# Patient Record
Sex: Female | Born: 1935 | Race: White | Hispanic: No
Health system: Southern US, Community
[De-identification: ages and names within clinical notes are randomized; demographics above are authoritative.]

---

## 2005-05-02 ENCOUNTER — Ambulatory Visit: Payer: Self-pay | Admitting: Internal Medicine

## 2006-06-14 ENCOUNTER — Ambulatory Visit: Payer: Self-pay | Admitting: Internal Medicine

## 2007-05-05 ENCOUNTER — Ambulatory Visit: Payer: Self-pay | Admitting: Internal Medicine

## 2007-06-18 ENCOUNTER — Ambulatory Visit: Payer: Self-pay | Admitting: Internal Medicine

## 2007-12-15 ENCOUNTER — Other Ambulatory Visit: Payer: Self-pay

## 2007-12-15 ENCOUNTER — Emergency Department: Payer: Self-pay | Admitting: Internal Medicine

## 2007-12-21 ENCOUNTER — Other Ambulatory Visit: Payer: Self-pay

## 2007-12-21 ENCOUNTER — Inpatient Hospital Stay: Payer: Self-pay | Admitting: Specialist

## 2009-01-15 ENCOUNTER — Encounter: Payer: Self-pay | Admitting: Internal Medicine

## 2009-01-25 ENCOUNTER — Encounter: Payer: Self-pay | Admitting: Internal Medicine

## 2009-02-25 ENCOUNTER — Encounter: Payer: Self-pay | Admitting: Internal Medicine

## 2009-06-10 ENCOUNTER — Ambulatory Visit: Payer: Self-pay | Admitting: Internal Medicine

## 2013-11-22 ENCOUNTER — Observation Stay: Payer: Self-pay | Admitting: Family Medicine

## 2013-11-22 LAB — BASIC METABOLIC PANEL
Anion Gap: 10 (ref 7–16)
BUN: 12 mg/dL (ref 7–18)
CALCIUM: 8.7 mg/dL (ref 8.5–10.1)
CREATININE: 0.53 mg/dL — AB (ref 0.60–1.30)
Chloride: 102 mmol/L (ref 98–107)
Co2: 26 mmol/L (ref 21–32)
EGFR (African American): >60
EGFR (Non-African Amer.): >60
GLUCOSE: 67 mg/dL (ref 65–99)
OSMOLALITY: 274 (ref 275–301)
Potassium: 3.4 mmol/L — ABNORMAL LOW (ref 3.5–5.1)
SODIUM: 138 mmol/L (ref 136–145)

## 2013-11-22 LAB — URINALYSIS, COMPLETE
Bacteria: NONE SEEN
Bilirubin,UR: NEGATIVE
Blood: NEGATIVE
Glucose,UR: NEGATIVE mg/dL (ref 0–75)
Leukocyte Esterase: NEGATIVE
NITRITE: NEGATIVE
PH: 5 (ref 4.5–8.0)
Protein: NEGATIVE
RBC,UR: <1 /HPF (ref 0–5)
SPECIFIC GRAVITY: 1.021 (ref 1.003–1.030)
Squamous Epithelial: 3
WBC UR: 1 /HPF (ref 0–5)

## 2013-11-22 LAB — CBC WITH DIFFERENTIAL/PLATELET
BASOS ABS: 0.1 10*3/uL (ref 0.0–0.1)
BASOS PCT: 0.5 %
EOS ABS: 0.1 10*3/uL (ref 0.0–0.7)
Eosinophil %: 1 %
HCT: 36.1 % (ref 35.0–47.0)
HGB: 11.5 g/dL — ABNORMAL LOW (ref 12.0–16.0)
LYMPHS ABS: 1.2 10*3/uL (ref 1.0–3.6)
Lymphocyte %: 10.1 %
MCH: 27.9 pg (ref 26.0–34.0)
MCHC: 31.7 g/dL — ABNORMAL LOW (ref 32.0–36.0)
MCV: 88 fL (ref 80–100)
MONO ABS: 0.6 x10 3/mm (ref 0.2–0.9)
MONOS PCT: 4.9 %
NEUTROS PCT: 83.5 %
Neutrophil #: 9.9 10*3/uL — ABNORMAL HIGH (ref 1.4–6.5)
PLATELETS: 457 10*3/uL — AB (ref 150–440)
RBC: 4.1 10*6/uL (ref 3.80–5.20)
RDW: 14.4 % (ref 11.5–14.5)
WBC: 11.8 10*3/uL — ABNORMAL HIGH (ref 3.6–11.0)

## 2013-11-23 LAB — CBC WITH DIFFERENTIAL/PLATELET
BASOS PCT: 0 %
Basophil #: 0 10*3/uL (ref 0.0–0.1)
Eosinophil #: 0 10*3/uL (ref 0.0–0.7)
Eosinophil %: 0 %
HCT: 30.7 % — ABNORMAL LOW (ref 35.0–47.0)
HGB: 9.9 g/dL — ABNORMAL LOW (ref 12.0–16.0)
Lymphocyte #: 0.6 10*3/uL — ABNORMAL LOW (ref 1.0–3.6)
Lymphocyte %: 5.9 %
MCH: 28 pg (ref 26.0–34.0)
MCHC: 32.3 g/dL (ref 32.0–36.0)
MCV: 87 fL (ref 80–100)
Monocyte #: 0.3 x10 3/mm (ref 0.2–0.9)
Monocyte %: 2.6 %
NEUTROS ABS: 9.3 10*3/uL — AB (ref 1.4–6.5)
Neutrophil %: 91.5 %
PLATELETS: 424 10*3/uL (ref 150–440)
RBC: 3.53 10*6/uL — AB (ref 3.80–5.20)
RDW: 14.3 % (ref 11.5–14.5)
WBC: 10.2 10*3/uL (ref 3.6–11.0)

## 2013-11-23 LAB — BASIC METABOLIC PANEL
ANION GAP: 6 — AB (ref 7–16)
BUN: 13 mg/dL (ref 7–18)
Calcium, Total: 7.9 mg/dL — ABNORMAL LOW (ref 8.5–10.1)
Chloride: 104 mmol/L (ref 98–107)
Co2: 27 mmol/L (ref 21–32)
Creatinine: 0.57 mg/dL — ABNORMAL LOW (ref 0.60–1.30)
EGFR (African American): >60
Glucose: 132 mg/dL — ABNORMAL HIGH (ref 65–99)
Osmolality: 276 (ref 275–301)
POTASSIUM: 3.6 mmol/L (ref 3.5–5.1)
Sodium: 137 mmol/L (ref 136–145)

## 2013-11-25 ENCOUNTER — Ambulatory Visit
Admit: 2013-11-25 | Disposition: A | Discharge status: Home / Self Care | Payer: Self-pay | Attending: Nurse Practitioner | Admitting: Nurse Practitioner

## 2013-11-25 ENCOUNTER — Encounter: Payer: Self-pay | Admitting: Internal Medicine

## 2013-12-03 LAB — CBC WITH DIFFERENTIAL/PLATELET
BASOS ABS: 0.1 10*3/uL (ref 0.0–0.1)
Basophil %: 0.3 %
Eosinophil #: 0.1 10*3/uL (ref 0.0–0.7)
Eosinophil %: 0.5 %
HCT: 30.9 % — ABNORMAL LOW (ref 35.0–47.0)
HGB: 9.8 g/dL — ABNORMAL LOW (ref 12.0–16.0)
Lymphocyte #: 0.9 10*3/uL — ABNORMAL LOW (ref 1.0–3.6)
Lymphocyte %: 4.9 %
MCH: 27.4 pg (ref 26.0–34.0)
MCHC: 31.8 g/dL — AB (ref 32.0–36.0)
MCV: 86 fL (ref 80–100)
Monocyte #: 0.7 x10 3/mm (ref 0.2–0.9)
Monocyte %: 3.9 %
Neutrophil #: 17.2 10*3/uL — ABNORMAL HIGH (ref 1.4–6.5)
Neutrophil %: 90.4 %
PLATELETS: 519 10*3/uL — AB (ref 150–440)
RBC: 3.58 10*6/uL — ABNORMAL LOW (ref 3.80–5.20)
RDW: 14.5 % (ref 11.5–14.5)
WBC: 19.1 10*3/uL — AB (ref 3.6–11.0)

## 2013-12-03 LAB — BASIC METABOLIC PANEL
ANION GAP: 9 (ref 7–16)
BUN: 17 mg/dL (ref 7–18)
CHLORIDE: 101 mmol/L (ref 98–107)
CO2: 25 mmol/L (ref 21–32)
Calcium, Total: 8.2 mg/dL — ABNORMAL LOW (ref 8.5–10.1)
Creatinine: 0.77 mg/dL (ref 0.60–1.30)
EGFR (African American): >60
EGFR (Non-African Amer.): >60
GLUCOSE: 70 mg/dL (ref 65–99)
Osmolality: 270 (ref 275–301)
Potassium: 4.6 mmol/L (ref 3.5–5.1)
Sodium: 135 mmol/L — ABNORMAL LOW (ref 136–145)

## 2013-12-03 LAB — HEPATIC FUNCTION PANEL A (ARMC)
ALT: 16 U/L (ref 12–78)
Albumin: 1.7 g/dL — ABNORMAL LOW (ref 3.4–5.0)
Alkaline Phosphatase: 241 U/L — ABNORMAL HIGH
Bilirubin, Direct: <0.1 mg/dL (ref 0.00–0.20)
Bilirubin,Total: 0.3 mg/dL (ref 0.2–1.0)
SGOT(AST): 64 U/L — ABNORMAL HIGH (ref 15–37)
Total Protein: 6 g/dL — ABNORMAL LOW (ref 6.4–8.2)

## 2013-12-03 LAB — TSH: Thyroid Stimulating Horm: 4.88 u[IU]/mL — ABNORMAL HIGH

## 2013-12-09 LAB — CBC WITH DIFFERENTIAL/PLATELET
BASOS ABS: 0 10*3/uL (ref 0.0–0.1)
BASOS PCT: 0 %
EOS ABS: 0 10*3/uL (ref 0.0–0.7)
EOS PCT: 0.2 %
HCT: 28.9 % — AB (ref 35.0–47.0)
HGB: 9.2 g/dL — ABNORMAL LOW (ref 12.0–16.0)
LYMPHS ABS: 1.9 10*3/uL (ref 1.0–3.6)
LYMPHS PCT: 11.1 %
MCH: 27.8 pg (ref 26.0–34.0)
MCHC: 31.7 g/dL — ABNORMAL LOW (ref 32.0–36.0)
MCV: 88 fL (ref 80–100)
MONO ABS: 0.9 x10 3/mm (ref 0.2–0.9)
Monocyte %: 5.3 %
Neutrophil #: 14.6 10*3/uL — ABNORMAL HIGH (ref 1.4–6.5)
Neutrophil %: 83.4 %
Platelet: 522 10*3/uL — ABNORMAL HIGH (ref 150–440)
RBC: 3.31 10*6/uL — ABNORMAL LOW (ref 3.80–5.20)
RDW: 14.8 % — AB (ref 11.5–14.5)
WBC: 17.5 10*3/uL — AB (ref 3.6–11.0)

## 2013-12-25 DEATH — deceased

## 2014-10-18 NOTE — H&P (Signed)
PATIENT NAME:  Joyce Vega, Joyce Vega MR#:  [id number] DATE OF BIRTH:  11-25-1921  DATE OF ADMISSION:  11/22/2013  ADMITTING PHYSICIAN: Joyce Baas, MD.   PRIMARY CARE PHYSICIAN: Dr. Burnadette Vega  CHIEF COMPLAINT: Fall and back pain.   HISTORY OF PRESENT ILLNESS: Ms. Joyce Vega is an 81 year old Caucasian female with past medical history significant for severe interstitial pulmonary fibrosis, hypertension, hyperlipidemia, gastroesophageal reflux disease who lives at home with her daughter, was brought in secondary to 2  falls this week and worsening low back. According to daughter, who gives most of the history, the patient has been losing weight for the last 6 months and her appetite has become poor.  She was just started on Remeron as an outpatient last week and that did improve her appetite a little bit.  However, she has become more weak and had 2 falls last week while climbing into her antique bed. For the last 3 days since the second fall, her ambulation and has been decreased. She has been pretty much bed bound and hurts a lot in the mid back whenever she tries to move. Daughter was not able to take care of her. Her urine was getting dark. She was having worsening breathing and cough while lying in bed.  She is not eating or drinking anything in the last 3 days, so she brought her to the hospital. The patient appears dehydrated and has an age-indeterminate compression fracture, so she is being admitted under observation.   PAST MEDICAL HISTORY: 1.  Hyperlipidemia.  2.  Gastroesophageal reflux disease. 3.  Pulmonary fibrosis.  4.  Glaucoma.  5.  Severe osteoporosis.   PAST SURGICAL HISTORY: Right carotid endarterectomy, cataract surgery bilaterally.   ALLERGIES TO MEDICATIONS: No known drug allergies.  CURRENT HOME MEDICATIONS:  1.  Remeron 15 mg p.o. daily at bedtime.  2.  Aspirin 81 mg p.o. daily.  3.  Lovastatin 40 mg p.o. daily.  4.  Calcium vitamin D 1 tablet p.o. b.i.d.   SOCIAL  HISTORY: Lives at home. Daughter lives with her. Usually ambulatory with cane and assistance, but over the last 4 days not able to walk at all. No alcohol abuse. Used to smoke but quit more than 20 years ago.   FAMILY HISTORY: Noncontributory at this time.  REVIEW OF SYSTEMS:  CONSTITUTIONAL: No fever, fatigue or weakness.  EYES: Positive for glaucoma. No blurred vision, double vision or cataracts. Uses reading glasses.  ENT: No tinnitus, ear pain, hearing loss, epistaxis or discharge.  RESPIRATORY: Positive for coughing, minimal wheeze, positive dyspnea on exertion, COPD.  CARDIOVASCULAR: No chest pain, orthopnea, edema, arrhythmia, palpitations or syncope.  GASTROINTESTINAL: No nausea, vomiting, diarrhea, abdominal pain, hematemesis or melena.  GENITOURINARY: Positive for decreased frequency of urination and concentrated dark urine but no dysuria.  ENDOCRINE: No polyuria, nocturia, thyroid problems, heat or cold intolerance.  HEMATOLOGY: No anemia, easy bruising or bleeding.  SKIN: No acne, rash or lesions.  MUSCULOSKELETAL: Positive for low back and mid back pain. No arthritis or gout.  NEUROLOGIC: No numbness, weakness, CVA, TIA or seizures.  PSYCHOLOGICAL: No anxiety, insomnia or depression.   PHYSICAL EXAMINATION: VITAL SIGNS: Temperature 97.6 degrees Fahrenheit, pulse 84, respirations 20, blood pressure 114/56, pulse ox 94% on room air.  GENERAL: Very well-built, ill-nourished female lying in bed, not in any acute distress.  HEENT: Normocephalic, atraumatic. Pupils equal, round, reacting to light. Anicteric sclerae. Extraocular movements intact.  Oropharynx is clear without erythema, mass or exudates. NECK: Supple. No thyromegaly, JVD or carotid  bruits.  No lymphadenopathy.  LUNGS: She has diffuse coarse crackles bilaterally. No use of accessory muscles for breathing. No wheezing.  CARDIOVASCULAR: S1, S2, regular rate and rhythm. A 3/6 systolic murmur heard. No rubs or gallops.   ABDOMEN: Soft, nontender, nondistended. No hepatosplenomegaly. Normal bowel sounds.  EXTREMITIES: No pedal edema. No clubbing or cyanosis, 2+ dorsalis pedis pulses palpable bilaterally.  SKIN: No acne, rash or lesions.  LYMPHATICS: no cervical or inguinal lymphadenopathy NEUROLOGIC: Cranial nerves intact. No focal motor or sensory deficits. PSYCHOLOGIC:  The patient is awake, alert, oriented x 3.   LABORATORY DATA: WBC 11.8, hemoglobin 9.5, hematocrit 36.1, platelet 457.   Sodium 138, potassium 3.4, chloride 102, bicarbonate 26, BUN 12, creatinine 0.53, glucose 67 and calcium of 8.7. Urinalysis negative for any infection.  Chest x-ray showing diffuse fibrotic changes which are chronic, increased from prior exam. No acute infiltrates seen.   ASSESSMENT AND PLAN: This is an 81 year old female with chronic interstitial pulmonary fibrosis, hypertension, hyperlipidemia, who has been having weight loss for 6 months now and frequent falls, comes with severe low back pain and inability to move. 1.  Acute mid back pain with recent fall. CT at this time showing age indeterminate compression fractures and severe osteopenia. Will admit under observation for pain management and physical therapy. 2.  Pulmonary fibrosis with possible acute bronchitis with worsening dyspnea and also cough. Started on oral prednisone taper and Z-Pak and continue to monitor.  3.  Hyperlipemia on statin.  4.  Anorexia. Continue the Remeron, which was just started as an outpatient.  CODE STATUS: DO NOT RESUSCITATE, as the patient has out of facility DO NOT RESUSCITATE form signed by her PCP.  TOTAL TIME SPENT ON ADMISSION:  50 minutes cc    ____________________________ Joyce Joyce Essica Kiker, MD rk:[id number] D: 11/22/2013 16:19:18 ET T: 11/22/2013 17:07:26 ET JOB#: [id number]  cc: Joyce Joyce Joyce Tout, MD, <Dictator> Joyce Vega Linthavong, MD Joyce Joyce Kinberly Perris MD ELECTRONICALLY SIGNED 12/07/2013 14:06

## 2014-10-18 NOTE — Discharge Summary (Signed)
PATIENT NAME:  Joyce Vega, Joyce Vega MR#:  [id number] DATE OF BIRTH:  08-14-1921  DATE OF ADMISSION:  11/22/2013 DATE OF DISCHARGE:  11/25/2013  DISCHARGE DIAGNOSES:  1.  Back pain secondary to thoracic compression fracture, age undetermined.  2.  Acute bronchitis.  3.  Generalized weakness/falls risk.   DISCHARGE MEDICATIONS:  1.  Lovastatin 20 mg 2 tabs p.o. at bedtime.  2.  Mirtazapine 15 mg p.o. at bedtime.  3.  Acetaminophen 1000 mg p.o. every 8 hours scheduled.  4.  Prednisone  20 mg x1 dose tomorrow and then prednisone 10 mg one dose the following day and stop.  5.  Tramadol 50 mg p.o. q.6 to 8 hours as needed for breakthrough pain.  6.  Azithromycin 250 mg p.o. daily x 1 more dose, stop date is 11/27/2013.  7.  Calcium/vitamin D 500/200, 1 tab p.o. b.i.d. with meals.   PERTINENT LABORATORIES AND STUDIES: CT of the thoracic spine showed mild mid thoracic vertebral body compression fracture, age undetermined. Prior to discharge sodium 137, potassium 3.6, creatinine 0.57, glucose 132. White blood cell count 10.2, hemoglobin 9.9, platelets of 424.   BRIEF HOSPITAL COURSE:  1.  Acute back pain. The patient came in with back pain consistent with compression fracture thoracic spine seen on CT, age undetermined. Her pain was controlled without surgical intervention with Ultram for breakthrough pain and Tylenol as scheduled. The patient is [facility]ant to [facility] and also intolerance to MIACALCIN. We will continue with vitamin D and calcium supplementation at this time.  2.  Acute bronchitis. The patient came in with cough and shortness of breath consistent with acute bronchitis with underlying fibrosis. She was started on steroid therapy and Z-Pak and symptoms have started to improve. She will complete course of the prednisone taper and 1 more day of the Z-Pak. 3.  Generalized weakness. The patient has significant falls risk evaluated by PT meets criteria for SNF and further therapy. She is being  transferred to a skilled nursing facility for further rehab.   DISPOSITION: She is in stable condition to discharge out of the hospital for further rehab at a skilled nursing facility. [facility] after she is discharged from the facility.   TOTAL TIME OF DISCHARGE PLANNING: Greater than 30 minutes throughout the day.  ____________________________ Marisue IvanKanhka Aldea Avis, MD kl:aw D: 11/25/2013 14:00:32 ET T: 11/25/2013 14:08:23 ET JOB#: [id number]  cc: Marisue IvanKanhka Jaysin Gayler, MD, <Dictator> Marisue IvanKANHKA Sharmaine Bain MD ELECTRONICALLY SIGNED 12/24/2013 11:32

## 2016-01-07 IMAGING — CT CT THORACIC SPINE WITHOUT CONTRAST
4 of 11 series · 11 of 33 positions shown, 12 images · non-contrast
Comparison: Chest x-ray 11/22/2013 and 12/21/2007.

CLINICAL DATA: Fall.

EXAM:
CT THORACIC SPINE WITHOUT CONTRAST
TECHNIQUE: Multidetector CT imaging of the thoracic spine was performed without
intravenous contrast administration. Multiplanar CT image
reconstructions were also generated.

[Series 4: soft tissue · axial · 0.34mm/px · z∈[-408,-256]mm · 3 of 154 slices shown, 4 images]
[im 39/154  soft-tissue]
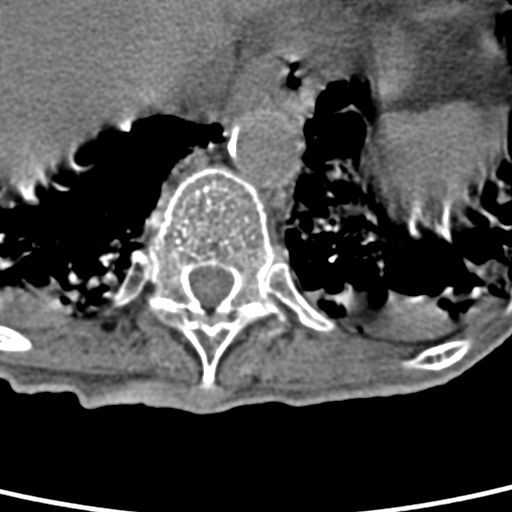
[im 39/154  bone]
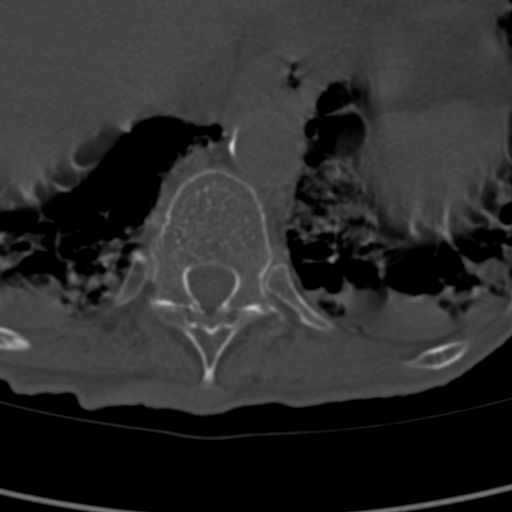
[im 77/154  bone]
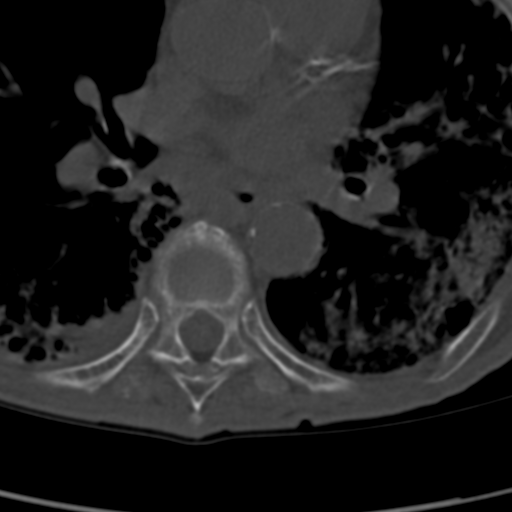
[im 115/154  bone]
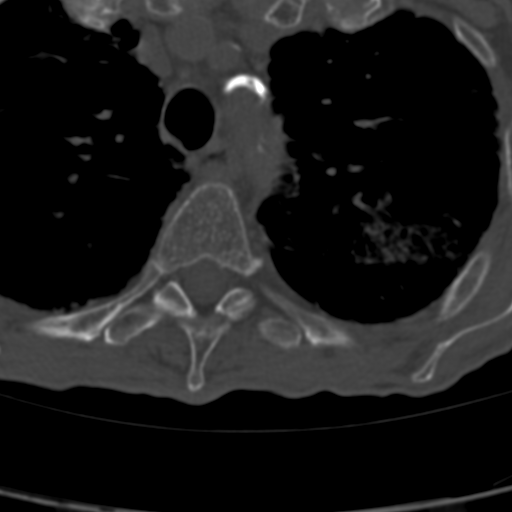

[Series 10: coronal bone · coronal · 0.34mm/px · 1 of 78 slices shown]
[im 39/78  bone]
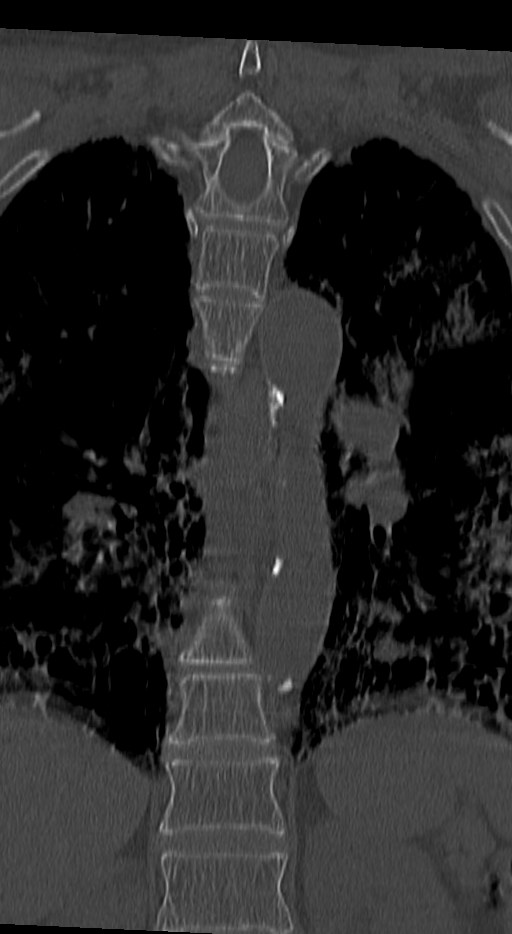

[Series 11: sagittal bone-- · sagittal · 0.36mm/px · 5 of 84 slices shown]
[im 14/84  bone]
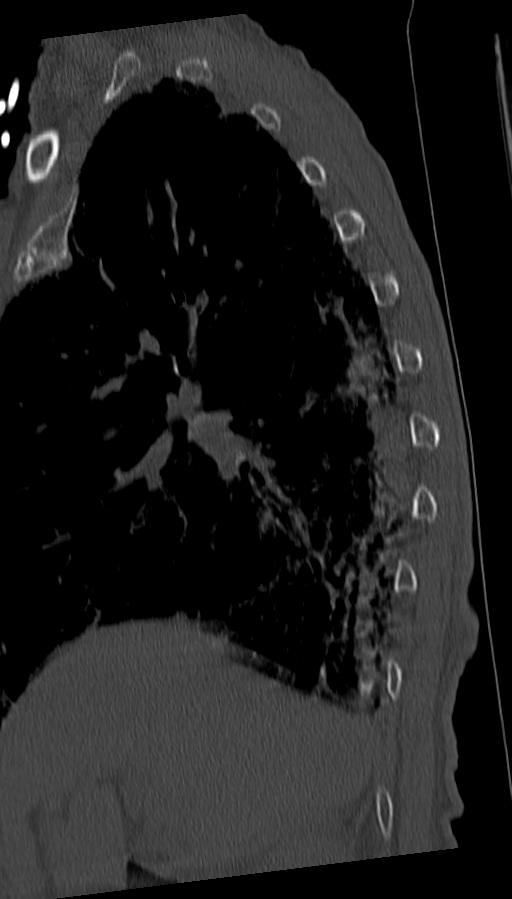
[im 28/84  bone]
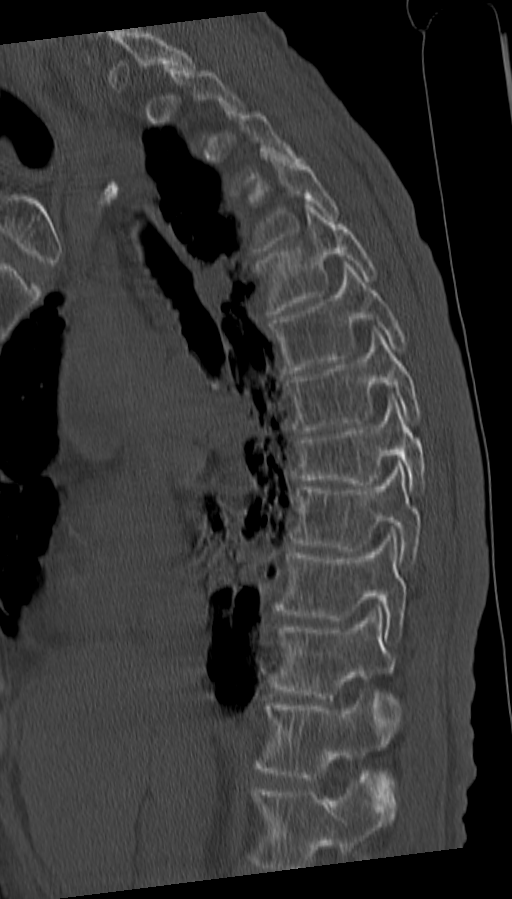
[im 42/84  bone]
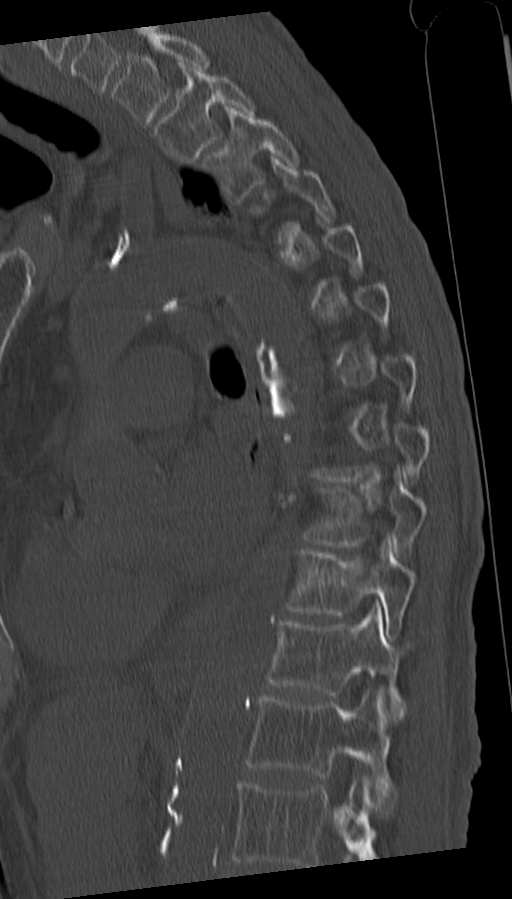
[im 56/84  bone]
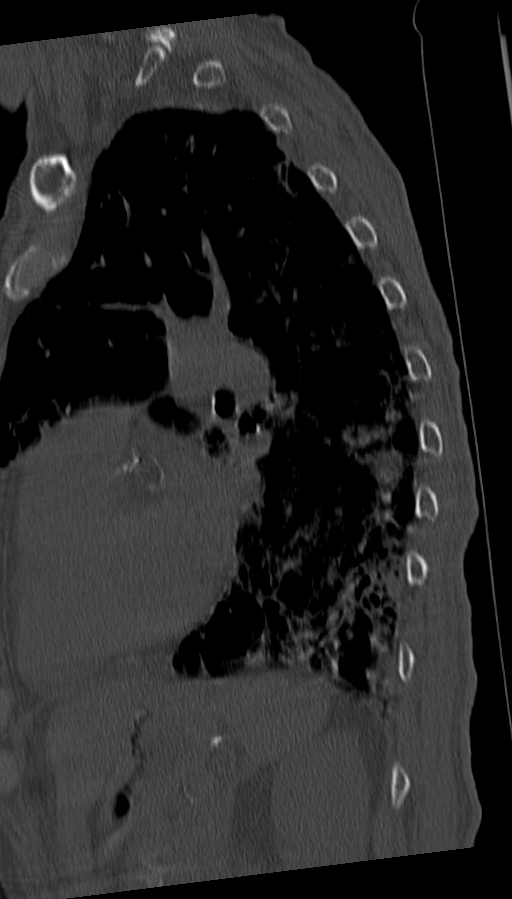
[im 70/84  bone]
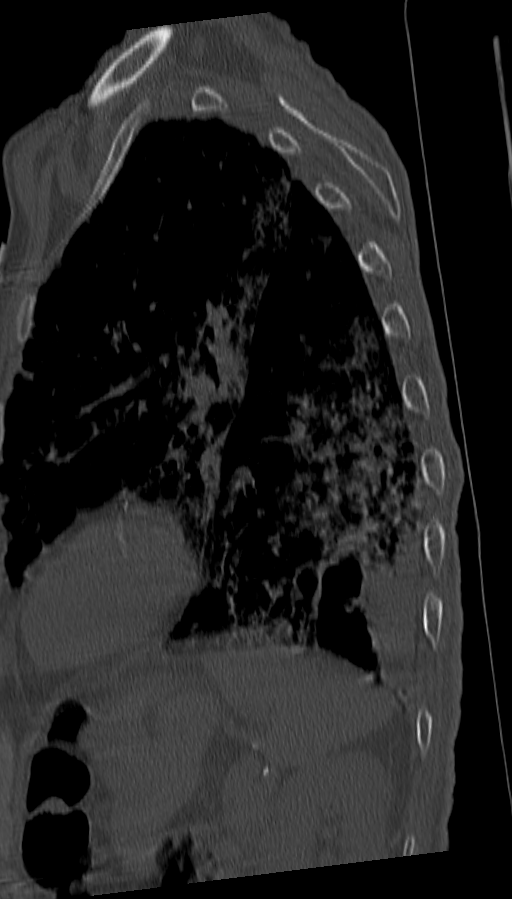

[Series 12: l spine soft · axial · 0.27mm/px · z∈[-600,-532]mm · 2 of 103 slices shown]
[im 35/103  soft-tissue]
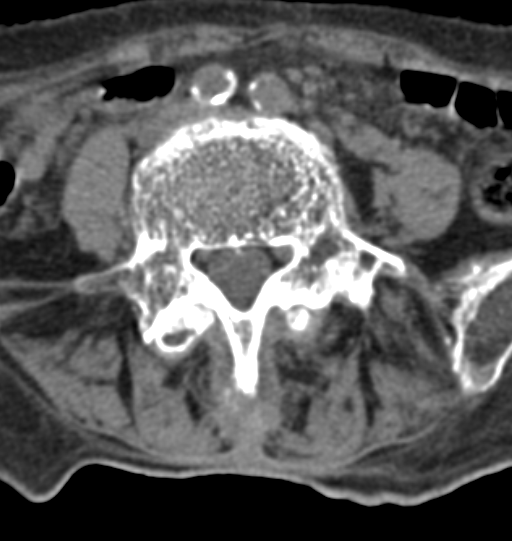
[im 69/103  soft-tissue]
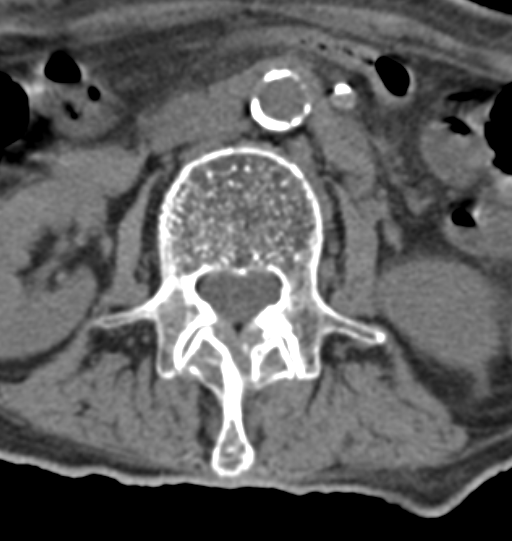

[11 of 33 positions shown; findings below may reference images not displayed]

FINDINGS: Diffuse multilevel disc degeneration endplate osteophyte formation
and facet hypertrophy is present. Diffuse osteopenia is present. A
mild mid thoracic vertebral body compression fracture, age
undetermined is noted. This is new from prior chest x-ray of
12/21/2007. No retropulsed fragments or significant spinal stenosis
noted at this level. Diffuse severe pulmonary interstitial lung
disease present. A component of this interstitial lung disease may
be chronic however active interstitial disease cannot be excluded.
No paraspinal lesions are identified. Aortic atherosclerotic
vascular disease. Coronary artery disease.
IMPRESSION: 1. Mild mid thoracic vertebral body compression fracture, age
undetermined. No significant retropulsed fragments or spinal
stenosis .
2. Diffuse severe osteopenia DJD.
3. Severe pulmonary interstitial lung disease.

## 2016-01-07 IMAGING — CR DG CHEST 2V
1 series · 2 of 2 positions shown · non-contrast
Comparison: 12/21/2007

CLINICAL DATA: Cough

EXAM:
CHEST  2 VIEW

[Series 1: x chest ap · 0.14mm/px · 2 of 2 slices shown]
[im 1/2]
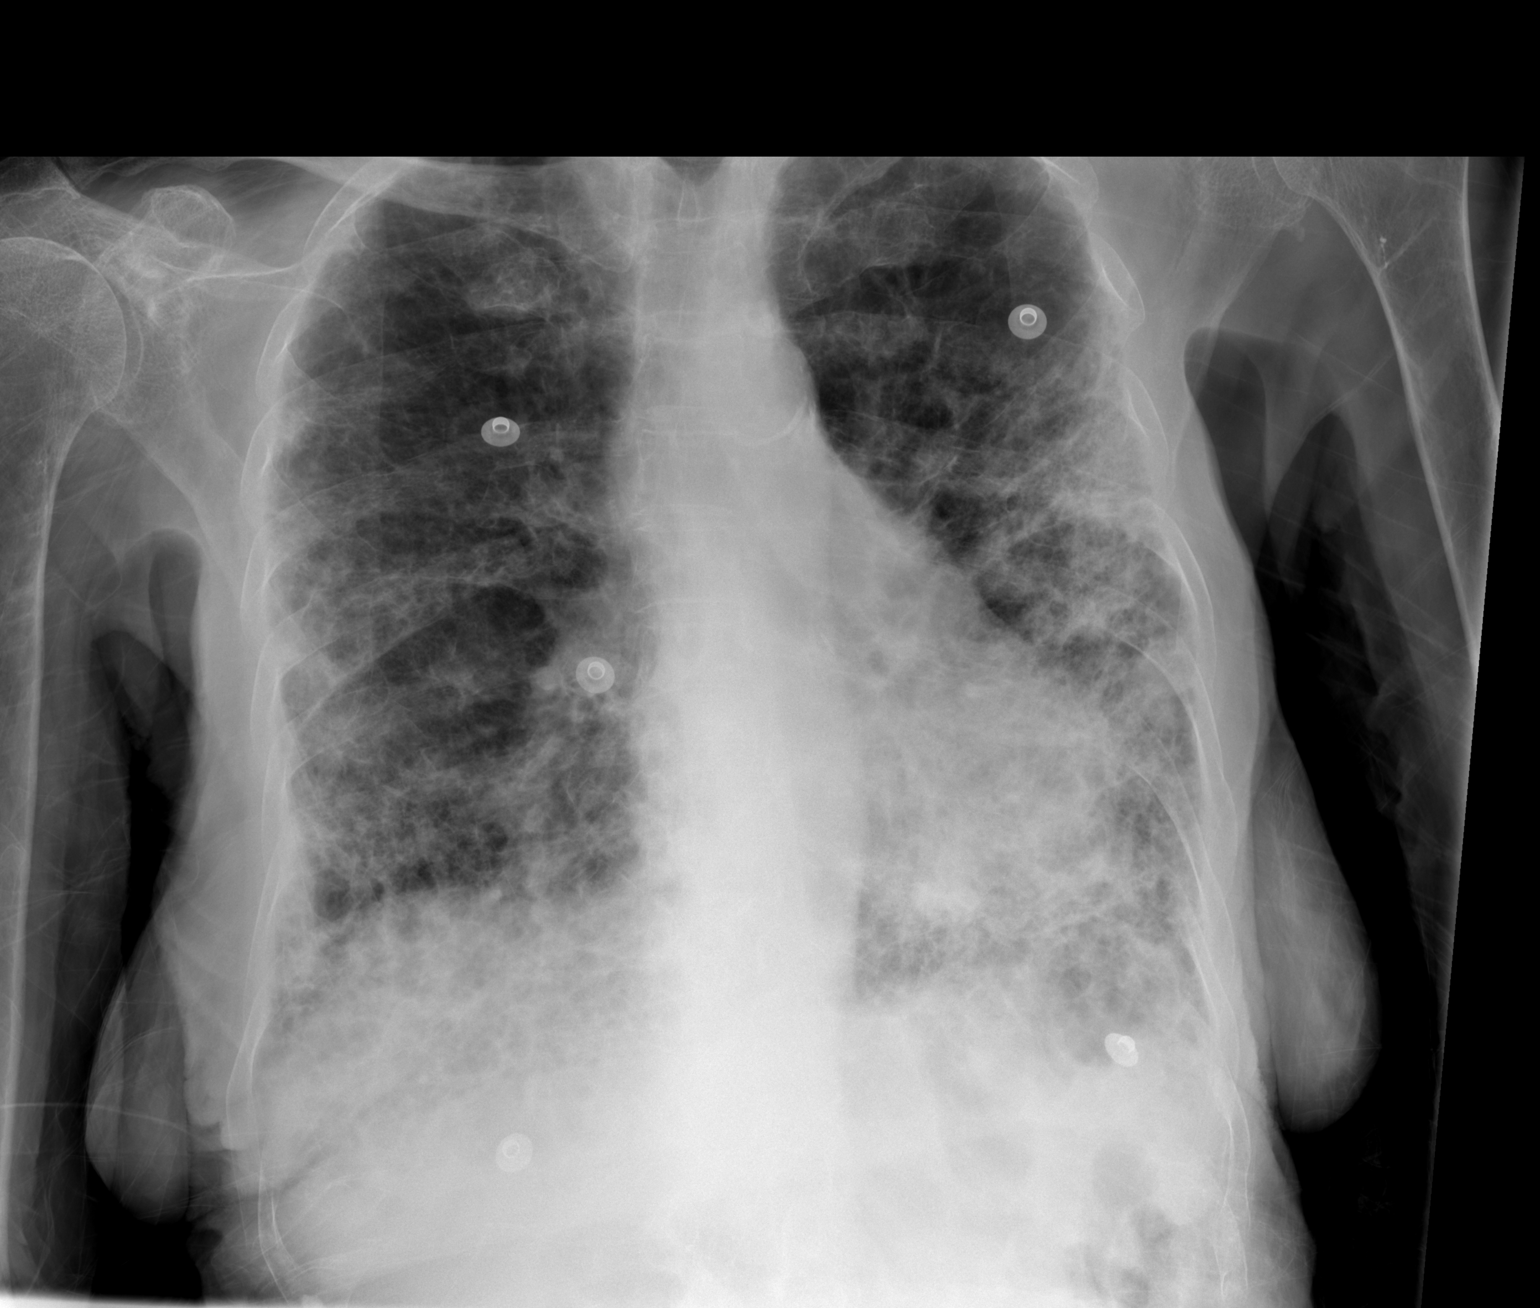
[im 2/2]
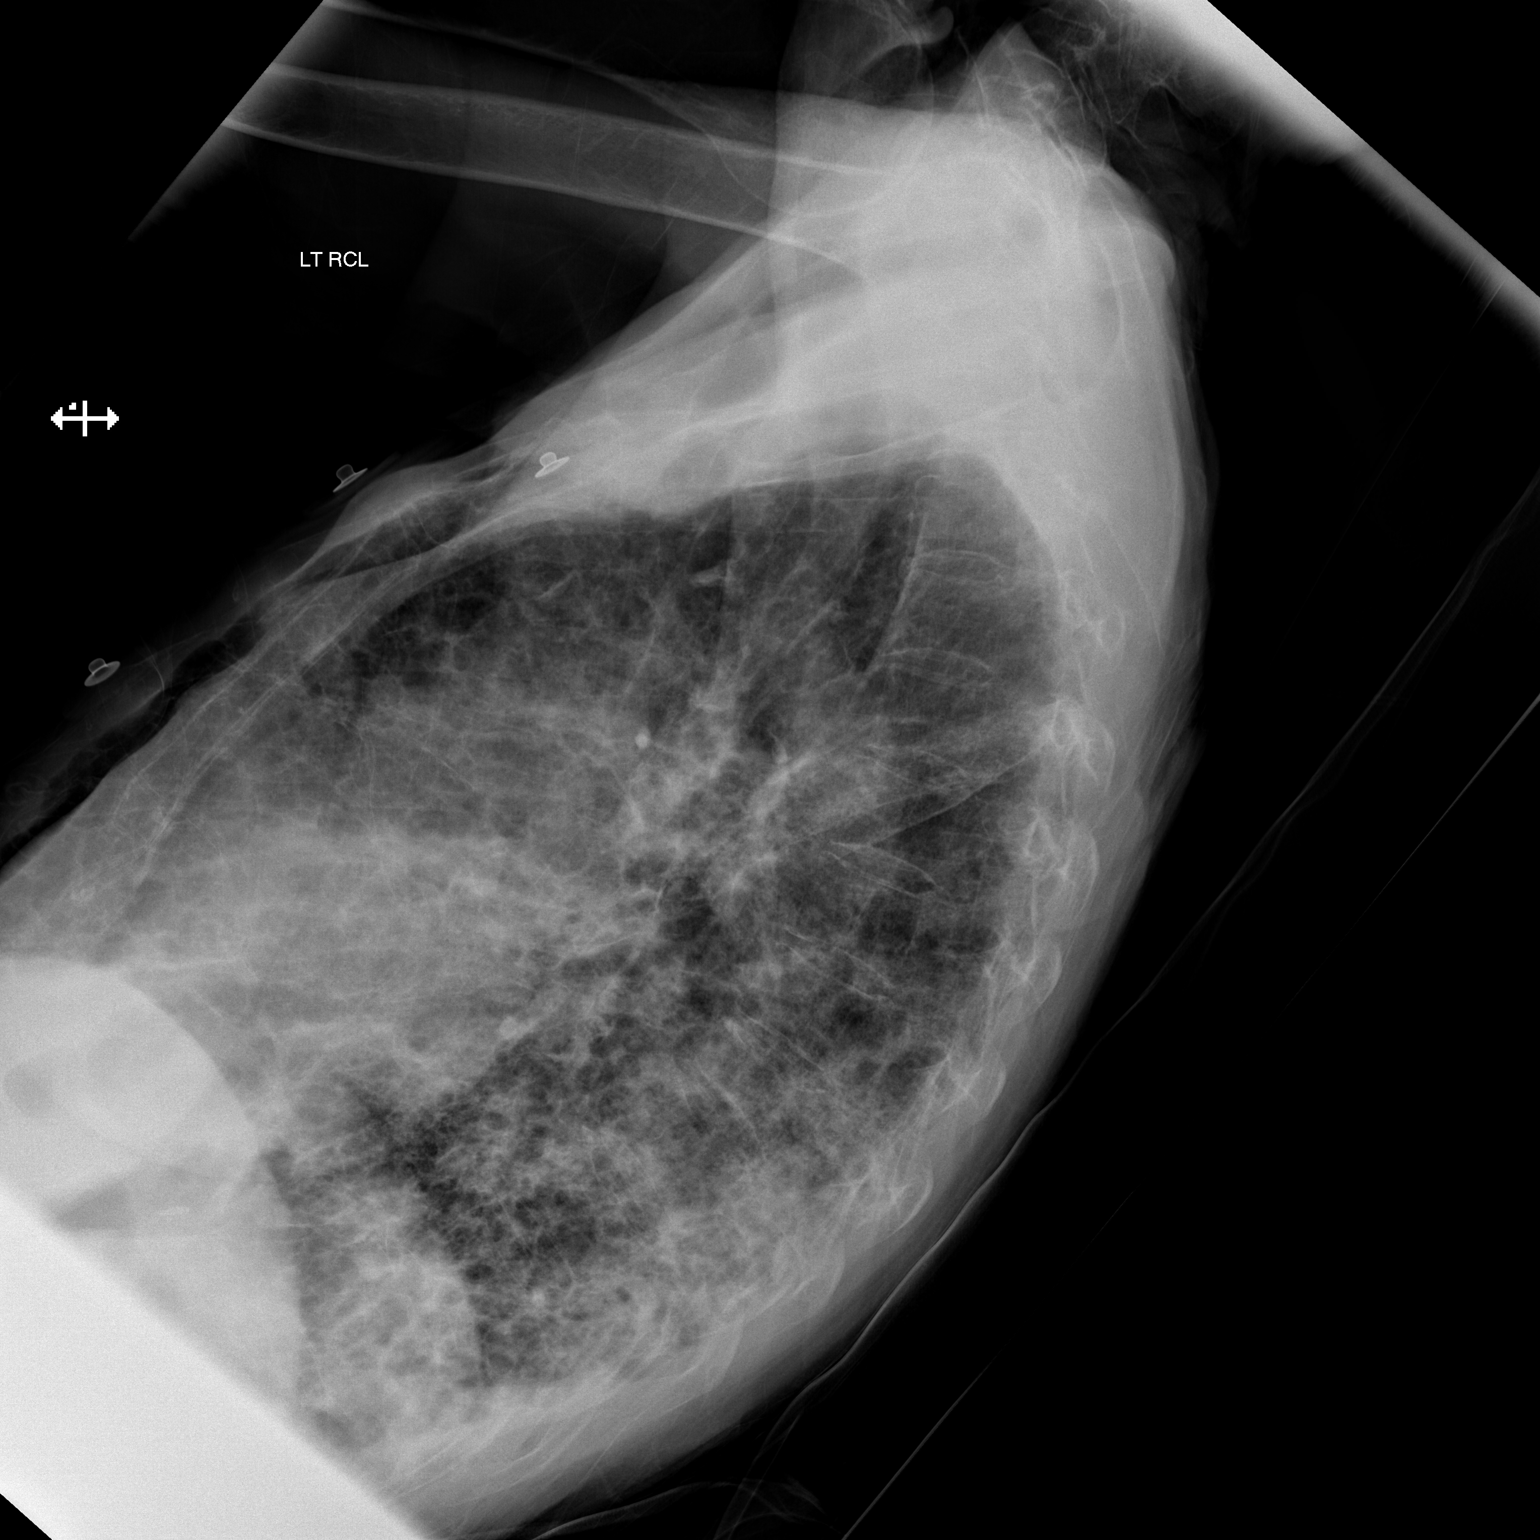

[2 of 2 positions shown; findings below may reference images not displayed]

FINDINGS: Cardiac shadow is stable. Diffuse fibrotic changes are noted
throughout both lungs worse than that seen on the prior exam. These
changes likely represent chronic pulmonary fibrosis although the
possibility of an acute superimposed infiltrate cannot be totally
excluded. No acute bony abnormality is seen.
IMPRESSION: Diffuse fibrotic changes which appear chronic in nature. They are
increased from the prior exam. No definitive acute infiltrate is
seen
